# Patient Record
Sex: Female | Born: 1990 | Race: Black or African American | Hispanic: No | Marital: Single | Smoking: Never smoker
Health system: Southern US, Community
[De-identification: ages and names within clinical notes are randomized; demographics above are authoritative.]

## PROBLEM LIST (undated history)

## (undated) DIAGNOSIS — I10 Essential (primary) hypertension: Secondary | ICD-10-CM

## (undated) DIAGNOSIS — Z8619 Personal history of other infectious and parasitic diseases: Secondary | ICD-10-CM

## (undated) HISTORY — PX: NO PAST SURGERIES: SHX2092

---

## 2009-02-07 ENCOUNTER — Emergency Department (HOSPITAL_COMMUNITY): Admission: EM | Admit: 2009-02-07 | Discharge: 2009-02-07 | Payer: Self-pay | Admitting: Emergency Medicine

## 2009-05-06 ENCOUNTER — Emergency Department (HOSPITAL_COMMUNITY): Admission: EM | Admit: 2009-05-06 | Discharge: 2009-05-06 | Payer: Self-pay | Admitting: Family Medicine

## 2009-05-08 ENCOUNTER — Emergency Department (HOSPITAL_BASED_OUTPATIENT_CLINIC_OR_DEPARTMENT_OTHER): Admission: EM | Admit: 2009-05-08 | Discharge: 2009-05-08 | Payer: Self-pay | Admitting: Emergency Medicine

## 2009-11-21 ENCOUNTER — Emergency Department (HOSPITAL_BASED_OUTPATIENT_CLINIC_OR_DEPARTMENT_OTHER): Admission: EM | Admit: 2009-11-21 | Discharge: 2009-11-21 | Payer: Self-pay | Admitting: Emergency Medicine

## 2009-12-17 ENCOUNTER — Emergency Department (HOSPITAL_COMMUNITY): Admission: EM | Admit: 2009-12-17 | Discharge: 2009-12-17 | Payer: Self-pay | Admitting: Emergency Medicine

## 2010-02-25 ENCOUNTER — Emergency Department (HOSPITAL_BASED_OUTPATIENT_CLINIC_OR_DEPARTMENT_OTHER)
Admission: EM | Admit: 2010-02-25 | Discharge: 2010-02-25 | Payer: Self-pay | Source: Home / Self Care | Admitting: Emergency Medicine

## 2010-03-23 ENCOUNTER — Emergency Department (HOSPITAL_BASED_OUTPATIENT_CLINIC_OR_DEPARTMENT_OTHER)
Admission: EM | Admit: 2010-03-23 | Discharge: 2010-03-23 | Payer: Self-pay | Source: Home / Self Care | Admitting: Emergency Medicine

## 2010-03-26 LAB — COMPREHENSIVE METABOLIC PANEL
ALT: 34 U/L (ref 0–35)
AST: 32 U/L (ref 0–37)
Albumin: 4.8 g/dL (ref 3.5–5.2)
Alkaline Phosphatase: 66 U/L (ref 39–117)
BUN: 7 mg/dL (ref 6–23)
CO2: 24 mEq/L (ref 19–32)
Calcium: 9.6 mg/dL (ref 8.4–10.5)
Chloride: 108 mEq/L (ref 96–112)
Creatinine, Ser: 0.7 mg/dL (ref 0.4–1.2)
GFR calc Af Amer: >60 mL/min (ref >60)
GFR calc non Af Amer: >60 mL/min (ref >60)
Glucose, Bld: 90 mg/dL (ref 70–99)
Potassium: 4.5 mEq/L (ref 3.5–5.1)
Sodium: 145 mEq/L (ref 135–145)
Total Bilirubin: 1.2 mg/dL (ref 0.3–1.2)
Total Protein: 9 g/dL — ABNORMAL HIGH (ref 6.0–8.3)

## 2010-03-26 LAB — CBC
HCT: 37.9 % (ref 36.0–46.0)
Hemoglobin: 13.6 g/dL (ref 12.0–15.0)
MCH: 29.5 pg (ref 26.0–34.0)
MCHC: 35.9 g/dL (ref 30.0–36.0)
MCV: 82.2 fL (ref 78.0–100.0)
Platelets: 226 10*3/uL (ref 150–400)
RBC: 4.61 MIL/uL (ref 3.87–5.11)
RDW: 12.1 % (ref 11.5–15.5)
WBC: 3.7 10*3/uL — ABNORMAL LOW (ref 4.0–10.5)

## 2010-03-26 LAB — DIFFERENTIAL
Basophils Absolute: 0 10*3/uL (ref 0.0–0.1)
Basophils Relative: 1 % (ref 0–1)
Eosinophils Absolute: 0 10*3/uL (ref 0.0–0.7)
Eosinophils Relative: 1 % (ref 0–5)
Lymphocytes Relative: 31 % (ref 12–46)
Lymphs Abs: 1.2 10*3/uL (ref 0.7–4.0)
Monocytes Absolute: 0.4 10*3/uL (ref 0.1–1.0)
Monocytes Relative: 10 % (ref 3–12)
Neutro Abs: 2.2 10*3/uL (ref 1.7–7.7)
Neutrophils Relative %: 58 % (ref 43–77)

## 2010-03-26 LAB — URINALYSIS, ROUTINE W REFLEX MICROSCOPIC
Hgb urine dipstick: NEGATIVE
Ketones, ur: 15 mg/dL — AB
Nitrite: NEGATIVE
Protein, ur: NEGATIVE mg/dL
Specific Gravity, Urine: 1.027 (ref 1.005–1.030)
Urine Glucose, Fasting: NEGATIVE mg/dL
Urobilinogen, UA: 1 mg/dL (ref 0.0–1.0)
pH: 6 (ref 5.0–8.0)

## 2010-03-26 LAB — LIPASE, BLOOD: Lipase: 126 U/L (ref 23–300)

## 2010-03-26 LAB — URINE MICROSCOPIC-ADD ON

## 2010-03-26 LAB — PREGNANCY, URINE: Preg Test, Ur: NEGATIVE

## 2010-05-24 LAB — URINALYSIS, ROUTINE W REFLEX MICROSCOPIC
Bilirubin Urine: NEGATIVE
Glucose, UA: NEGATIVE mg/dL
Hgb urine dipstick: NEGATIVE
Ketones, ur: NEGATIVE mg/dL
Nitrite: NEGATIVE
Protein, ur: NEGATIVE mg/dL
Specific Gravity, Urine: 1.011 (ref 1.005–1.030)
Urobilinogen, UA: 1 mg/dL (ref 0.0–1.0)
pH: 7.5 (ref 5.0–8.0)

## 2010-05-24 LAB — POCT URINALYSIS DIPSTICK
Bilirubin Urine: NEGATIVE
Glucose, UA: NEGATIVE mg/dL
Hgb urine dipstick: NEGATIVE
Nitrite: NEGATIVE
Protein, ur: NEGATIVE mg/dL
Specific Gravity, Urine: 1.015 (ref 1.005–1.030)
Urobilinogen, UA: 1 mg/dL (ref 0.0–1.0)
pH: 7 (ref 5.0–8.0)

## 2010-05-24 LAB — DIFFERENTIAL
Basophils Absolute: 0 10*3/uL (ref 0.0–0.1)
Basophils Relative: 1 % (ref 0–1)
Eosinophils Absolute: 0 10*3/uL (ref 0.0–0.7)
Eosinophils Relative: 1 % (ref 0–5)
Lymphocytes Relative: 29 % (ref 12–46)
Lymphs Abs: 1.3 10*3/uL (ref 0.7–4.0)
Monocytes Absolute: 0.5 10*3/uL (ref 0.1–1.0)
Monocytes Relative: 10 % (ref 3–12)
Neutro Abs: 2.7 10*3/uL (ref 1.7–7.7)
Neutrophils Relative %: 59 % (ref 43–77)

## 2010-05-24 LAB — WET PREP, GENITAL
Trich, Wet Prep: NONE SEEN
Yeast Wet Prep HPF POC: NONE SEEN

## 2010-05-24 LAB — BASIC METABOLIC PANEL
BUN: 8 mg/dL (ref 6–23)
CO2: 26 mEq/L (ref 19–32)
Calcium: 9.2 mg/dL (ref 8.4–10.5)
Chloride: 105 mEq/L (ref 96–112)
Creatinine, Ser: 0.7 mg/dL (ref 0.4–1.2)
GFR calc Af Amer: >60 mL/min (ref >60)
GFR calc non Af Amer: >60 mL/min (ref >60)
Glucose, Bld: 91 mg/dL (ref 70–99)
Potassium: 3.9 mEq/L (ref 3.5–5.1)
Sodium: 141 mEq/L (ref 135–145)

## 2010-05-24 LAB — CBC
HCT: 35.3 % — ABNORMAL LOW (ref 36.0–46.0)
Hemoglobin: 12.3 g/dL (ref 12.0–15.0)
MCH: 30.4 pg (ref 26.0–34.0)
MCHC: 34.9 g/dL (ref 30.0–36.0)
MCV: 87.2 fL (ref 78.0–100.0)
Platelets: 195 10*3/uL (ref 150–400)
RBC: 4.05 MIL/uL (ref 3.87–5.11)
RDW: 13.1 % (ref 11.5–15.5)
WBC: 4.5 10*3/uL (ref 4.0–10.5)

## 2010-05-24 LAB — GC/CHLAMYDIA PROBE AMP, GENITAL
Chlamydia, DNA Probe: NEGATIVE
GC Probe Amp, Genital: NEGATIVE

## 2010-05-24 LAB — PREGNANCY, URINE: Preg Test, Ur: NEGATIVE

## 2010-05-24 LAB — POCT PREGNANCY, URINE: Preg Test, Ur: NEGATIVE

## 2011-02-27 ENCOUNTER — Ambulatory Visit: Payer: Self-pay

## 2011-09-05 ENCOUNTER — Emergency Department (HOSPITAL_BASED_OUTPATIENT_CLINIC_OR_DEPARTMENT_OTHER)
Admission: EM | Admit: 2011-09-05 | Discharge: 2011-09-05 | Disposition: A | Discharge status: Home / Self Care | Payer: Medicaid Other | Attending: Emergency Medicine | Admitting: Emergency Medicine

## 2011-09-05 ENCOUNTER — Encounter (HOSPITAL_BASED_OUTPATIENT_CLINIC_OR_DEPARTMENT_OTHER): Payer: Self-pay | Admitting: *Deleted

## 2011-09-05 ENCOUNTER — Emergency Department (HOSPITAL_BASED_OUTPATIENT_CLINIC_OR_DEPARTMENT_OTHER): Payer: Medicaid Other

## 2011-09-05 DIAGNOSIS — O2340 Unspecified infection of urinary tract in pregnancy, unspecified trimester: Secondary | ICD-10-CM

## 2011-09-05 DIAGNOSIS — O99891 Other specified diseases and conditions complicating pregnancy: Secondary | ICD-10-CM | POA: Insufficient documentation

## 2011-09-05 DIAGNOSIS — R109 Unspecified abdominal pain: Secondary | ICD-10-CM | POA: Insufficient documentation

## 2011-09-05 DIAGNOSIS — N390 Urinary tract infection, site not specified: Secondary | ICD-10-CM | POA: Insufficient documentation

## 2011-09-05 DIAGNOSIS — O210 Mild hyperemesis gravidarum: Secondary | ICD-10-CM | POA: Insufficient documentation

## 2011-09-05 DIAGNOSIS — O219 Vomiting of pregnancy, unspecified: Secondary | ICD-10-CM

## 2011-09-05 DIAGNOSIS — O2390 Unspecified genitourinary tract infection in pregnancy, unspecified trimester: Secondary | ICD-10-CM | POA: Insufficient documentation

## 2011-09-05 LAB — COMPREHENSIVE METABOLIC PANEL
ALT: 10 U/L (ref 0–35)
AST: 15 U/L (ref 0–37)
Albumin: 4.5 g/dL (ref 3.5–5.2)
Alkaline Phosphatase: 37 U/L — ABNORMAL LOW (ref 39–117)
BUN: 5 mg/dL — ABNORMAL LOW (ref 6–23)
CO2: 25 mEq/L (ref 19–32)
Calcium: 9.6 mg/dL (ref 8.4–10.5)
Chloride: 100 mEq/L (ref 96–112)
Creatinine, Ser: 0.5 mg/dL (ref 0.50–1.10)
GFR calc Af Amer: >90 mL/min (ref >90)
GFR calc non Af Amer: >90 mL/min (ref >90)
Glucose, Bld: 85 mg/dL (ref 70–99)
Potassium: 3.4 mEq/L — ABNORMAL LOW (ref 3.5–5.1)
Sodium: 134 mEq/L — ABNORMAL LOW (ref 135–145)
Total Bilirubin: 0.9 mg/dL (ref 0.3–1.2)
Total Protein: 8.2 g/dL (ref 6.0–8.3)

## 2011-09-05 LAB — CBC WITH DIFFERENTIAL/PLATELET
Basophils Absolute: 0 10*3/uL (ref 0.0–0.1)
Basophils Relative: 0 % (ref 0–1)
Eosinophils Absolute: 0 10*3/uL (ref 0.0–0.7)
Eosinophils Relative: 1 % (ref 0–5)
HCT: 33.5 % — ABNORMAL LOW (ref 36.0–46.0)
Hemoglobin: 12.2 g/dL (ref 12.0–15.0)
Lymphocytes Relative: 31 % (ref 12–46)
Lymphs Abs: 1.2 10*3/uL (ref 0.7–4.0)
MCH: 31.4 pg (ref 26.0–34.0)
MCHC: 36.4 g/dL — ABNORMAL HIGH (ref 30.0–36.0)
MCV: 86.3 fL (ref 78.0–100.0)
Monocytes Absolute: 0.4 10*3/uL (ref 0.1–1.0)
Monocytes Relative: 11 % (ref 3–12)
Neutro Abs: 2.1 10*3/uL (ref 1.7–7.7)
Neutrophils Relative %: 57 % (ref 43–77)
Platelets: 189 10*3/uL (ref 150–400)
RBC: 3.88 MIL/uL (ref 3.87–5.11)
RDW: 11.5 % (ref 11.5–15.5)
WBC: 3.7 10*3/uL — ABNORMAL LOW (ref 4.0–10.5)

## 2011-09-05 LAB — PREGNANCY, URINE: Preg Test, Ur: POSITIVE — AB

## 2011-09-05 LAB — URINALYSIS, ROUTINE W REFLEX MICROSCOPIC
Bilirubin Urine: NEGATIVE
Glucose, UA: NEGATIVE mg/dL
Hgb urine dipstick: NEGATIVE
Ketones, ur: 15 mg/dL — AB
Nitrite: NEGATIVE
Protein, ur: NEGATIVE mg/dL
Specific Gravity, Urine: 1.024 (ref 1.005–1.030)
Urobilinogen, UA: 1 mg/dL (ref 0.0–1.0)
pH: 6 (ref 5.0–8.0)

## 2011-09-05 LAB — URINE MICROSCOPIC-ADD ON: RBC / HPF: NONE SEEN RBC/hpf (ref <3)

## 2011-09-05 LAB — LIPASE, BLOOD: Lipase: 38 U/L (ref 11–59)

## 2011-09-05 MED ORDER — NITROFURANTOIN MONOHYD MACRO 100 MG PO CAPS: 100.0000 mg | ORAL_CAPSULE | Freq: Two times a day (BID) | ORAL | Status: AC

## 2011-09-05 MED ORDER — ONDANSETRON HCL 4 MG/2ML IJ SOLN
4.0000 mg | Freq: Once | INTRAMUSCULAR | Status: AC
  Administered 2011-09-05: 4 mg via INTRAVENOUS
  Filled 2011-09-05: qty 2

## 2011-09-05 MED ORDER — SODIUM CHLORIDE 0.9 % IV BOLUS (SEPSIS)
1000.0000 mL | Freq: Once | INTRAVENOUS | Status: AC
Start: 2011-09-05 — End: 2011-09-05
  Administered 2011-09-05: 1000 mL via INTRAVENOUS

## 2011-09-05 MED ORDER — ONDANSETRON 4 MG PO TBDP: 4.0000 mg | ORAL_TABLET | Freq: Three times a day (TID) | ORAL | Status: AC | PRN

## 2011-09-05 NOTE — Discharge Instructions (Signed)
Urinary Tract Infection in Pregnancy A urinary tract infection (UTI) is a bacterial infection of the urinary tract. Infection of the urinary tract can include the ureters, kidneys (pyelonephritis), bladder (cystitis), and urethra (urethritis). All pregnant women should be screened for bacteria in the urinary tract. Identifying and treating a UTI will decrease the risk of preterm labor and developing more serious infections in both the mother and baby. CAUSES Bacteria germs cause almost all UTIs. There are many factors that can increase your chances of getting a UTI during pregnancy. These include:  Having a short urethra.   Poor toilet and hygiene habits.   Sexual intercourse.   Blockage of urine along the urinary tract.   Problems with the pelvic muscles or nerves.   Diabetes.   Obesity.   Bladder problems after having several children.   Previous history of UTI.  SYMPTOMS   Pain, burning, or a stinging feeling when urinating.   Suddenly feeling the need to urinate right away (urgency).   Loss of bladder control (urinary incontinence).   Frequent urination, more than is common with pregnancy.   Lower abdominal or back discomfort.   Bad smelling urine.   Cloudy urine.   Blood in the urine (hematuria).   Fever.  When the kidneys are infected, the symptoms may be:  Back pain.   Flank pain on the right side more so than the left.   Fever.   Chills.   Nausea.   Vomiting.  DIAGNOSIS   Urine tests.   Additional tests and procedures may include:   Ultrasound of the kidneys, ureters, bladder, and urethra.   Looking in the bladder with a lighted tube (cystoscopy).   Certain X-ray studies only when absolutely necessary.  Finding out the results of your test Ask when your test results will be ready. Make sure you get your test results. TREATMENT  Antibiotic medicine by mouth.   Antibiotics given through the vein (intravenously), if needed.  HOME CARE  INSTRUCTIONS   Take your antibiotics as directed. Finish them even if you start to feel better. Only take medicine as directed by your caregiver.   Drink enough fluids to keep your urine clear or pale yellow.   Do not have sexual intercourse until the infection is gone and your caregiver says it is okay.   Make sure you are tested for UTIs throughout your pregnancy if you get one. These infections often come back.  Preventing a UTI in the future:  Practice good toilet habits. Always wipe from front to back. Use the tissue only once.   Do not hold your urine. Empty your bladder as soon as possible when the urge comes.   Do not douche or use deodorant sprays.   Wash with soap and warm water around the genital area and the anus.   Empty your bladder before and after sexual intercourse.   Wear underwear with a cotton crotch.   Avoid caffeine and carbonated drinks. They can irritate the bladder.   Drink cranberry juice or take cranberry pills. This may decrease the risk of getting a UTI.   Do not drink alcohol.   Keep all your appointments and tests as scheduled.  SEEK MEDICAL CARE IF:   Your symptoms get worse.   You are still having fevers 2 or more days after treatment begins.   You develop a rash.   You feel that you are having problems with medicines prescribed.   You develop abnormal vaginal discharge.  SEEK IMMEDIATE MEDICAL   CARE IF:   You develop back or flank pain.   You develop chills.   You have blood in your urine.   You develop nausea and vomiting.   You develop contractions of your uterus.   You have a gush of fluid from the vagina.  MAKE SURE YOU:   Understand these instructions.   Will watch your condition.   Will get help right away if you are not doing well or get worse.  Document Released: 06/22/2010 Document Revised: 02/14/2011 Document Reviewed: 06/22/2010 Exit[employer] Patient Information 2012 [employer]. 

## 2011-09-05 NOTE — ED Provider Notes (Signed)
History     CSN: [id number]  Arrival date & time 09/05/11  1325   First MD Initiated Contact with Patient 09/05/11 1412      Chief Complaint  Patient presents with  . Morning Sickness    (Consider location/radiation/quality/duration/timing/severity/associated sxs/prior treatment) HPI Comments: Pt states that based on lmp she she is 7 weeks:pt states that she had test and ultrasound at [facility] and they also gave phenergan:pt states that she called her doctor and   Patient is a 21 y.o. female presenting with vomiting. The history is provided by the patient. No language interpreter was used.  Emesis  This is a new problem. The current episode started more than 1 week ago. The problem occurs 5 to 10 times per day. The problem has been gradually worsening. The emesis has an appearance of bilious material. There has been no fever. Associated symptoms include abdominal pain. Pertinent negatives include no cough, no diarrhea and no fever.    History reviewed. No pertinent past medical history.  History reviewed. No pertinent past surgical history.  History reviewed. No pertinent family history.  History  Substance Use Topics  . Smoking status: Never Smoker   . Smokeless tobacco: Not on file  . Alcohol Use: No    OB History    Grav Para Term Preterm Abortions TAB SAB Ect Mult Living   1               Review of Systems  Constitutional: Negative for fever.  Respiratory: Negative for cough.   Cardiovascular: Negative.   Gastrointestinal: Positive for vomiting and abdominal pain. Negative for diarrhea.  Genitourinary: Negative for dysuria.  Musculoskeletal: Negative.     Allergies  Review of patient's allergies indicates no known allergies.  Home Medications  No current outpatient prescriptions on file.  BP 119/69  Pulse 79  Temp 98.8 F (37.1 C) (Oral)  Resp 20  Ht 5\' 5"  (1.651 m)  Wt 167 lb (75.751 kg)  BMI 27.79 kg/m2  SpO2 99%  LMP 07/11/2011  Physical  Exam  Constitutional: She is oriented to person, place, and time. She appears well-developed and well-nourished.  HENT:  Head: Normocephalic and atraumatic.  Eyes: EOM are normal.  Neck: Neck supple.  Cardiovascular: Normal rate and regular rhythm.   Pulmonary/Chest: Effort normal and breath sounds normal.  Abdominal: Soft. Bowel sounds are normal. There is no tenderness.  Musculoskeletal: Normal range of motion.  Neurological: She is alert and oriented to person, place, and time.  Skin: Skin is warm and dry.    ED Course  Procedures (including critical care time)  Labs Reviewed  URINALYSIS, ROUTINE W REFLEX MICROSCOPIC - Abnormal; Notable for the following:    APPearance CLOUDY (*)     Ketones, ur 15 (*)     Leukocytes, UA MODERATE (*)     All other components within normal limits  PREGNANCY, URINE - Abnormal; Notable for the following:    Preg Test, Ur POSITIVE (*)     All other components within normal limits  URINE MICROSCOPIC-ADD ON - Abnormal; Notable for the following:    Squamous Epithelial / LPF FEW (*)     Bacteria, UA MANY (*)     All other components within normal limits  COMPREHENSIVE METABOLIC PANEL  CBC WITH DIFFERENTIAL  LIPASE, BLOOD  URINE CULTURE   US Abdomen Complete  09/05/2011  *RADIOLOGY REPORT*  Clinical Data:  8-week pregnant.  Nausea, vomiting. Right upper quadrant pain.  COMPLETE ABDOMINAL ULTRASOUND  Comparison:  Ultrasound 03/23/2010  Findings:  Gallbladder:  No gallstones, gallbladder wall thickening, or pericholecystic fluid.  Common bile duct:   Within normal limits in caliber.  Liver:  No focal lesion identified.  Within normal limits in parenchymal echogenicity.  IVC:  Appears normal.  Pancreas:  No focal abnormality seen.  Spleen:  Within normal limits in size and echotexture.  Right Kidney:   Normal in size and parenchymal echogenicity.  No evidence of mass or hydronephrosis.  Left Kidney:  Normal in size and parenchymal echogenicity.  No  evidence of mass or hydronephrosis.  Abdominal aorta:  No aneurysm identified.  IMPRESSION: Negative abdominal ultrasound.  Original Report Authenticated By: Cyndie Chime, M.D.     1. Nausea/vomiting in pregnancy   2. UTI (urinary tract infection) during pregnancy       MDM  Pt tolerating po here:result obtained from [facility] and pt has pelvic ultrasound:pt has no signs of gallstones:will treat for possible ZOX:[id number] sent      Teressa Lower, NP 09/05/11 1738  Teressa Lower, NP 09/05/11 1739

## 2011-09-05 NOTE — ED Notes (Signed)
Pt states she thinks she is pregnant has been having nausea in the morning for the last week

## 2011-09-05 NOTE — ED Notes (Signed)
Called [facility] for labs, xrays, and US done on last visit.

## 2011-09-06 LAB — URINE CULTURE

## 2011-09-07 NOTE — ED Provider Notes (Signed)
Medical screening examination/treatment/procedure(s) were performed by non-physician practitioner and as supervising physician I was immediately available for consultation/collaboration.   Vann Okerlund, MD 09/07/11 0838 

## 2011-11-03 ENCOUNTER — Encounter (HOSPITAL_COMMUNITY): Payer: Self-pay | Admitting: *Deleted

## 2011-11-03 ENCOUNTER — Inpatient Hospital Stay (HOSPITAL_COMMUNITY)
Admission: AD | Admit: 2011-11-03 | Discharge: 2011-11-03 | Disposition: A | Discharge status: Home / Self Care | Payer: Medicaid Other | Source: Ambulatory Visit | Attending: Family Medicine | Admitting: Family Medicine

## 2011-11-03 DIAGNOSIS — R109 Unspecified abdominal pain: Secondary | ICD-10-CM

## 2011-11-03 DIAGNOSIS — O99891 Other specified diseases and conditions complicating pregnancy: Secondary | ICD-10-CM | POA: Insufficient documentation

## 2011-11-03 HISTORY — DX: Personal history of other infectious and parasitic diseases: Z86.19

## 2011-11-03 MED ORDER — IBUPROFEN 600 MG PO TABS
600.0000 mg | ORAL_TABLET | Freq: Once | ORAL | Status: AC
  Administered 2011-11-03: 600 mg via ORAL
  Filled 2011-11-03: qty 1

## 2011-11-03 NOTE — MAU Note (Signed)
Pt states, " I started having burning pain in there left mid abdomen at 8:30pm. I threw up one time since I've been hurting."

## 2011-11-03 NOTE — MAU Provider Note (Signed)
Chart reviewed and agree with management and plan.  

## 2011-11-03 NOTE — MAU Provider Note (Signed)
  History     CSN: [id number]  Arrival date and time: 11/03/11 4098   First Provider Initiated Contact with Patient 11/03/11 0131      Chief Complaint  Patient presents with  . Abdominal Pain   HPI Laura Lara is a 21yo [id number] at 15.6wks who presents with onset left mid abd pain at 2030. Describes pain as a 'burning' sensation. Feels better when she tightens her stomach muscles. Still having pain now but it is more diminished than it was earlier. Denies bldg or leak. Receives [facility] in [place] and says she has been having difficulty with hyperemesis this preg. Denies diarrhea or fever.  OB History    Grav Para Term Preterm Abortions TAB SAB Ect Mult Living   4 2 2  1  1   2       Past Medical History  Diagnosis Date  . H/O sexually transmitted disease     2010    Past Surgical History  Procedure Date  . No past surgeries     Family History  Problem Relation Age of Onset  . Other Neg Hx     History  Substance Use Topics  . Smoking status: Never Smoker   . Smokeless tobacco: Not on file  . Alcohol Use: No    Allergies: No Known Allergies  Prescriptions prior to admission  Medication Sig Dispense Refill  . ondansetron (ZOFRAN-ODT) 8 MG disintegrating tablet Take 8 mg by mouth every 8 (eight) hours as needed. Patient used this medication for nausea.      . Prenatal Vit-Fe Fumarate-FA (PRENATAL MULTIVITAMIN) TABS Take 1 tablet by mouth daily.        ROS Physical Exam   Blood pressure 131/83, pulse 102, temperature 98.5 F (36.9 C), temperature source Oral, height 5\' 4"  (1.626 m), weight 70.421 kg (155 lb 4 oz), last menstrual period 07/11/2011.  Physical Exam  Constitutional: She is oriented to person, place, and time. She appears well-developed.  HENT:  Head: Normocephalic.  Cardiovascular: Normal rate.   Respiratory: Effort normal.  GI: Soft.  Genitourinary: Vagina normal.       Cx C/L  Musculoskeletal: Normal range of motion.  Neurological: She is alert and  oriented to person, place, and time.  Skin: Skin is warm and dry.  Psychiatric: She has a normal mood and affect. Her behavior is normal. Thought content normal.    MAU Course  Procedures  MDM Given Motrin while in MAU and pain has subsided. She was unable to urinate while here and she elected to leave without a UA.   Assessment and Plan  IUP at 15.6wks Abd pain  Enc to try to drink more fluids as she is probably somewhat dehydrated. She has an OB appt on 8/28, but encouraged to call sooner if needed.  Cam Hai 11/03/2011, 1:46 AM

## 2012-12-30 ENCOUNTER — Encounter (HOSPITAL_BASED_OUTPATIENT_CLINIC_OR_DEPARTMENT_OTHER): Payer: Self-pay | Admitting: Emergency Medicine

## 2012-12-30 ENCOUNTER — Emergency Department (HOSPITAL_BASED_OUTPATIENT_CLINIC_OR_DEPARTMENT_OTHER)
Admission: EM | Admit: 2012-12-30 | Discharge: 2012-12-30 | Disposition: A | Discharge status: Home / Self Care | Payer: Medicaid Other | Attending: Emergency Medicine | Admitting: Emergency Medicine

## 2012-12-30 DIAGNOSIS — B3731 Acute candidiasis of vulva and vagina: Secondary | ICD-10-CM | POA: Insufficient documentation

## 2012-12-30 DIAGNOSIS — Z3202 Encounter for pregnancy test, result negative: Secondary | ICD-10-CM | POA: Insufficient documentation

## 2012-12-30 DIAGNOSIS — Z79899 Other long term (current) drug therapy: Secondary | ICD-10-CM | POA: Insufficient documentation

## 2012-12-30 DIAGNOSIS — I10 Essential (primary) hypertension: Secondary | ICD-10-CM | POA: Insufficient documentation

## 2012-12-30 DIAGNOSIS — N760 Acute vaginitis: Secondary | ICD-10-CM | POA: Insufficient documentation

## 2012-12-30 DIAGNOSIS — B373 Candidiasis of vulva and vagina: Secondary | ICD-10-CM | POA: Insufficient documentation

## 2012-12-30 HISTORY — DX: Essential (primary) hypertension: I10

## 2012-12-30 LAB — URINE MICROSCOPIC-ADD ON

## 2012-12-30 LAB — URINALYSIS, ROUTINE W REFLEX MICROSCOPIC
Glucose, UA: NEGATIVE mg/dL
Hgb urine dipstick: NEGATIVE
pH: 7.5 (ref 5.0–8.0)

## 2012-12-30 LAB — WET PREP, GENITAL: Trich, Wet Prep: NONE SEEN

## 2012-12-30 LAB — PREGNANCY, URINE: Preg Test, Ur: NEGATIVE

## 2012-12-30 MED ORDER — FLUCONAZOLE 150 MG PO TABS: 150.0000 mg | ORAL_TABLET | Freq: Once | ORAL | Status: AC

## 2012-12-30 NOTE — ED Notes (Signed)
Patient states she has vaginal itching and swelling for the three days ago.  States she was having intercourse and was using a latex condom and began to have itching.  States itching has intensified and is now swollen.  Denies vaginal discharge.

## 2012-12-30 NOTE — ED Provider Notes (Signed)
CSN: [id number]     Arrival date & time 12/30/12  1146 History   First MD Initiated Contact with Patient 12/30/12 1247     Chief Complaint  Patient presents with  . Vaginal Itching   (Consider location/radiation/quality/duration/timing/severity/associated sxs/prior Treatment) Patient is a 22 y.o. female presenting with vaginal itching. The history is provided by the patient. No language interpreter was used.  Vaginal Itching This is a new problem. Episode onset: 3 days. The problem occurs constantly. The problem has been unchanged. Associated symptoms comments: Vaginal discharge. Nothing aggravates the symptoms. She has tried nothing for the symptoms. The treatment provided no relief.    Past Medical History  Diagnosis Date  . H/O sexually transmitted disease     2010  . Hypertension    Past Surgical History  Procedure Laterality Date  . No past surgeries    . Cesarean section     Family History  Problem Relation Age of Onset  . Other Neg Hx    History  Substance Use Topics  . Smoking status: Never Smoker   . Smokeless tobacco: Not on file  . Alcohol Use: No   OB History   Grav Para Term Preterm Abortions TAB SAB Ect Mult Living   4 2 2  1  1   2      Review of Systems  Genitourinary: Positive for vaginal discharge.  All other systems reviewed and are negative.    Allergies  Review of patient's allergies indicates no known allergies.  Home Medications   Current Outpatient Rx  Name  Route  Sig  Dispense  Refill  . ALPRAZolam (XANAX) 0.5 MG tablet   Oral   Take 0.5 mg by mouth at bedtime as needed for sleep.         Marland Kitchen ondansetron (ZOFRAN-ODT) 8 MG disintegrating tablet   Oral   Take 8 mg by mouth every 8 (eight) hours as needed. Patient used this medication for nausea.         . Prenatal Vit-Fe Fumarate-FA (PRENATAL MULTIVITAMIN) TABS   Oral   Take 1 tablet by mouth daily.          BP 123/81  Temp(Src) 98.1 F (36.7 C) (Oral)  Resp 20  Ht 5\' 6"   (1.676 m)  Wt 193 lb (87.544 kg)  BMI 31.17 kg/m2  SpO2 100%  LMP 12/16/2012  Breastfeeding? Unknown Physical Exam  Vitals reviewed. Constitutional: She appears well-developed and well-nourished.  HENT:  Head: Normocephalic.  Eyes: Pupils are equal, round, and reactive to light.  Neck: Normal range of motion.  Cardiovascular: Normal rate.   Pulmonary/Chest: Effort normal.  Abdominal: Soft.  Genitourinary: Vaginal discharge found.  Musculoskeletal: Normal range of motion.  Neurological: She is alert.  Skin: Skin is warm.    ED Course  Procedures (including critical care time) Labs Review Labs Reviewed  WET PREP, GENITAL - Abnormal; Notable for the following:    Yeast Wet Prep HPF POC MODERATE (*)    WBC, Wet Prep HPF POC FEW (*)    All other components within normal limits  URINALYSIS, ROUTINE W REFLEX MICROSCOPIC - Abnormal; Notable for the following:    APPearance CLOUDY (*)    Leukocytes, UA SMALL (*)    All other components within normal limits  URINE MICROSCOPIC-ADD ON - Abnormal; Notable for the following:    Squamous Epithelial / LPF FEW (*)    Bacteria, UA MANY (*)    All other components within normal limits  URINE CULTURE  GC/CHLAMYDIA PROBE AMP  PREGNANCY, URINE   Imaging Review No results found.  EKG Interpretation   None       MDM   1. Yeast vaginitis    Diflucan.   Cultures pending    Elson Areas, PA-C 12/30/12 1344

## 2012-12-31 LAB — GC/CHLAMYDIA PROBE AMP
CT Probe RNA: NEGATIVE
GC Probe RNA: NEGATIVE

## 2012-12-31 LAB — URINE CULTURE

## 2012-12-31 NOTE — ED Provider Notes (Signed)
Medical screening examination/treatment/procedure(s) were performed by non-physician practitioner and as supervising physician I was immediately available for consultation/collaboration.  EKG Interpretation   None         Roney Marion, MD 12/31/12 [phone]

## 2013-05-07 ENCOUNTER — Emergency Department (HOSPITAL_BASED_OUTPATIENT_CLINIC_OR_DEPARTMENT_OTHER)
Admission: EM | Admit: 2013-05-07 | Discharge: 2013-05-07 | Disposition: A | Discharge status: Home / Self Care | Payer: Medicaid Other | Attending: Emergency Medicine | Admitting: Emergency Medicine

## 2013-05-07 ENCOUNTER — Encounter (HOSPITAL_BASED_OUTPATIENT_CLINIC_OR_DEPARTMENT_OTHER): Payer: Self-pay | Admitting: Emergency Medicine

## 2013-05-07 DIAGNOSIS — A6000 Herpesviral infection of urogenital system, unspecified: Secondary | ICD-10-CM

## 2013-05-07 DIAGNOSIS — I10 Essential (primary) hypertension: Secondary | ICD-10-CM | POA: Insufficient documentation

## 2013-05-07 DIAGNOSIS — Z79899 Other long term (current) drug therapy: Secondary | ICD-10-CM | POA: Insufficient documentation

## 2013-05-07 MED ORDER — ACYCLOVIR 200 MG PO CAPS
400.0000 mg | ORAL_CAPSULE | Freq: Once | ORAL | Status: AC
  Administered 2013-05-07: 400 mg via ORAL
  Filled 2013-05-07: qty 2

## 2013-05-07 MED ORDER — ACYCLOVIR 400 MG PO TABS: 400.0000 mg | ORAL_TABLET | Freq: Three times a day (TID) | ORAL | Status: AC

## 2013-05-07 NOTE — ED Notes (Signed)
Red, painful area to her right buttocks since yesterday. She thinks it may be a bug bite. No hx of abscess.

## 2013-05-07 NOTE — Discharge Instructions (Signed)
Genital Herpes  Genital herpes is a sexually transmitted disease. This means that it is a disease passed by having sex with an infected person. There is no cure for genital herpes. The time between attacks can be months to years. The virus may live in a person but produce no problems (symptoms). This infection can be passed to a baby as it travels down the birth canal (vagina). In a newborn, this can cause central nervous system damage, eye damage, or even death. The virus that causes genital herpes is usually HSV-2 virus. The virus that causes oral herpes is usually HSV-1. The diagnosis (learning what is wrong) is made through culture results.  SYMPTOMS   Usually symptoms of pain and itching begin a few days to a week after contact. It first appears as small blisters that progress to small painful ulcers which then scab over and heal after several days. It affects the outer genitalia, birth canal, cervix, penis, anal area, buttocks, and thighs.  HOME CARE INSTRUCTIONS   · Keep ulcerated areas dry and clean.  · Take medications as directed. Antiviral medications can speed up healing. They will not prevent recurrences or cure this infection. These medications can also be taken for suppression if there are frequent recurrences.  · While the infection is active, it is contagious. Avoid all sexual contact during active infections.  · Condoms may help prevent spread of the herpes virus.  · Practice safe sex.  · Wash your hands thoroughly after touching the genital area.  · Avoid touching your eyes after touching your genital area.  · Inform your caregiver if you have had genital herpes and become pregnant. It is your responsibility to insure a safe outcome for your baby in this pregnancy.  · Only take over-the-counter or prescription medicines for pain, discomfort, or fever as directed by your caregiver.  SEEK MEDICAL CARE IF:   · You have a recurrence of this infection.  · You do not respond to medications and are not  improving.  · You have new sources of pain or discharge which have changed from the original infection.  · You have an oral temperature above 102° F (38.9° C).  · You develop abdominal pain.  · You develop eye pain or signs of eye infection.  Document Released: 02/23/2000 Document Revised: 05/20/2011 Document Reviewed: 03/15/2009  Exit[employer]® Patient Information ©2014 [employer].

## 2013-05-07 NOTE — ED Provider Notes (Signed)
CSN: [id number]     Arrival date & time 05/07/13  2235 History  This chart was scribed for Sriyan Cutting Smitty Cords, MD by Joaquin Music, ED Scribe. This patient was seen in room MH02/MH02 and the patient's care was started at 11:11 PM.   Chief Complaint  Patient presents with  . Insect Bite   Patient is a 23 y.o. female presenting with rash. The history is provided by the patient. No language interpreter was used.  Rash Location:  Ano-genital Ano-genital rash location:  R buttock Quality: painful and redness   Quality: not itchy   Pain details:    Quality:  Aching   Severity:  Mild   Onset quality:  Sudden   Timing:  Constant   Progression:  Unchanged Severity:  Mild Onset quality:  Sudden Timing:  Constant Progression:  Unchanged Chronicity:  New Relieved by:  Nothing Worsened by:  Nothing tried Ineffective treatments:  None tried  HPI Comments: Laura Lara is a 23 y.o. female who presents to the Emergency Department complaining of possible insect bit she noticed yesterday evening.Pt states area to R buttocks has been red and painful since yesterday afternoon. Pt suspects this may be a bug bite. Rates her current pain 5/10. She denies hx of anscess. Pt denies taking any OTC medications. Pt denies itching.  Past Medical History  Diagnosis Date  . H/O sexually transmitted disease     2010  . Hypertension    Past Surgical History  Procedure Laterality Date  . No past surgeries    . Cesarean section     Family History  Problem Relation Age of Onset  . Other Neg Hx    History  Substance Use Topics  . Smoking status: Never Smoker   . Smokeless tobacco: Not on file  . Alcohol Use: No   OB History   Grav Para Term Preterm Abortions TAB SAB Ect Mult Living   4 2 2  1  1   2      Review of Systems  Skin: Positive for rash.  All other systems reviewed and are negative.   Allergies  Review of patient's allergies indicates no known allergies.  Home  Medications   Current Outpatient Rx  Name  Route  Sig  Dispense  Refill  . ALPRAZolam (XANAX) 0.5 MG tablet   Oral   Take 0.5 mg by mouth at bedtime as needed for sleep.         . fluconazole (DIFLUCAN) 150 MG tablet   Oral   Take 1 tablet (150 mg total) by mouth once.   1 tablet   1   . ondansetron (ZOFRAN-ODT) 8 MG disintegrating tablet   Oral   Take 8 mg by mouth every 8 (eight) hours as needed. Patient used this medication for nausea.         . Prenatal Vit-Fe Fumarate-FA (PRENATAL MULTIVITAMIN) TABS   Oral   Take 1 tablet by mouth daily.          BP 129/83  Pulse 107  Temp(Src) 99.7 F (37.6 C) (Oral)  Resp 20  Ht 5\' 5"  (1.651 m)  Wt 193 lb (87.544 kg)  BMI 32.12 kg/m2  SpO2 100%  LMP 05/02/2013  Physical Exam  Constitutional: She is oriented to person, place, and time. She appears well-developed and well-nourished.  HENT:  Head: Normocephalic and atraumatic.  Mouth/Throat: Oropharynx is clear and moist. No oropharyngeal exudate.  Eyes: EOM are normal. Pupils are equal, round, and reactive to light.  Neck: Normal range of motion. Neck supple.  Cardiovascular: Normal rate, regular rhythm and normal heart sounds.  Exam reveals no gallop and no friction rub.   No murmur heard. Pulmonary/Chest: Breath sounds normal. She has no wheezes.  Abdominal: Soft. Bowel sounds are normal. There is no tenderness.  Musculoskeletal: Normal range of motion.  Neurological: She is alert and oriented to person, place, and time.  Skin: Skin is warm and dry.  Cluster of vesicles to R buttocks.  Psychiatric: She has a normal mood and affect.   ED Course  Procedures DIAGNOSTIC STUDIES: Oxygen Saturation is 100% on RA, normal by my interpretation.    COORDINATION OF CARE: 11:13 PM-Discussed treatment plan which includes discharge pt with medications and F/U with PCP or GU. Pt agreed to plan.   Labs Review Labs Reviewed - No data to display Imaging Review No results  found.   EKG Interpretation None     MDM   Final diagnoses:  None    Herpes outbreak will start acyclovir  I personally performed the services described in this documentation, which was scribed in my presence. The recorded information has been reviewed and is accurate.     Jasmine AweApril K Dunya Meiners-Rasch, MD 05/08/13 [phone]

## 2014-01-10 ENCOUNTER — Encounter (HOSPITAL_BASED_OUTPATIENT_CLINIC_OR_DEPARTMENT_OTHER): Payer: Self-pay | Admitting: Emergency Medicine

## 2015-12-29 ENCOUNTER — Encounter (HOSPITAL_BASED_OUTPATIENT_CLINIC_OR_DEPARTMENT_OTHER): Payer: Self-pay | Admitting: Emergency Medicine

## 2015-12-29 ENCOUNTER — Emergency Department (HOSPITAL_BASED_OUTPATIENT_CLINIC_OR_DEPARTMENT_OTHER)
Admission: EM | Admit: 2015-12-29 | Discharge: 2015-12-29 | Disposition: A | Discharge status: Home / Self Care | Payer: BLUE CROSS/BLUE SHIELD | Attending: Emergency Medicine | Admitting: Emergency Medicine

## 2015-12-29 DIAGNOSIS — Y999 Unspecified external cause status: Secondary | ICD-10-CM | POA: Diagnosis not present

## 2015-12-29 DIAGNOSIS — S161XXA Strain of muscle, fascia and tendon at neck level, initial encounter: Secondary | ICD-10-CM | POA: Diagnosis not present

## 2015-12-29 DIAGNOSIS — I10 Essential (primary) hypertension: Secondary | ICD-10-CM | POA: Insufficient documentation

## 2015-12-29 DIAGNOSIS — Y9389 Activity, other specified: Secondary | ICD-10-CM | POA: Diagnosis not present

## 2015-12-29 DIAGNOSIS — Y92410 Unspecified street and highway as the place of occurrence of the external cause: Secondary | ICD-10-CM | POA: Diagnosis not present

## 2015-12-29 DIAGNOSIS — Z79899 Other long term (current) drug therapy: Secondary | ICD-10-CM | POA: Diagnosis not present

## 2015-12-29 DIAGNOSIS — S199XXA Unspecified injury of neck, initial encounter: Secondary | ICD-10-CM | POA: Diagnosis present

## 2015-12-29 MED ORDER — CYCLOBENZAPRINE HCL 10 MG PO TABS: 10.0000 mg | ORAL_TABLET | Freq: Two times a day (BID) | ORAL | 0 refills | Status: AC | PRN
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
## 2019-08-14 ENCOUNTER — Emergency Department (HOSPITAL_BASED_OUTPATIENT_CLINIC_OR_DEPARTMENT_OTHER): Payer: Medicaid Other

## 2019-08-14 ENCOUNTER — Emergency Department (HOSPITAL_BASED_OUTPATIENT_CLINIC_OR_DEPARTMENT_OTHER)
Admission: EM | Admit: 2019-08-14 | Discharge: 2019-08-14 | Disposition: A | Discharge status: Home / Self Care | Payer: Medicaid Other | Attending: Emergency Medicine | Admitting: Emergency Medicine

## 2019-08-14 ENCOUNTER — Other Ambulatory Visit: Payer: Self-pay

## 2019-08-14 ENCOUNTER — Encounter (HOSPITAL_BASED_OUTPATIENT_CLINIC_OR_DEPARTMENT_OTHER): Payer: Self-pay | Admitting: Emergency Medicine

## 2019-08-14 DIAGNOSIS — B349 Viral infection, unspecified: Secondary | ICD-10-CM | POA: Diagnosis not present

## 2019-08-14 DIAGNOSIS — Z79899 Other long term (current) drug therapy: Secondary | ICD-10-CM | POA: Insufficient documentation

## 2019-08-14 DIAGNOSIS — R0602 Shortness of breath: Secondary | ICD-10-CM | POA: Diagnosis not present

## 2019-08-14 DIAGNOSIS — I10 Essential (primary) hypertension: Secondary | ICD-10-CM | POA: Diagnosis not present

## 2019-08-14 DIAGNOSIS — R519 Headache, unspecified: Secondary | ICD-10-CM | POA: Diagnosis not present

## 2019-08-14 DIAGNOSIS — Z20822 Contact with and (suspected) exposure to covid-19: Secondary | ICD-10-CM | POA: Diagnosis not present

## 2019-08-14 DIAGNOSIS — D72829 Elevated white blood cell count, unspecified: Secondary | ICD-10-CM | POA: Diagnosis not present

## 2019-08-14 DIAGNOSIS — R509 Fever, unspecified: Secondary | ICD-10-CM | POA: Diagnosis present

## 2019-08-14 LAB — COMPREHENSIVE METABOLIC PANEL
ALT: 48 U/L — ABNORMAL HIGH (ref 0–44)
AST: 49 U/L — ABNORMAL HIGH (ref 15–41)
Albumin: 4.4 g/dL (ref 3.5–5.0)
Alkaline Phosphatase: 73 U/L (ref 38–126)
Anion gap: 10 (ref 5–15)
BUN: 9 mg/dL (ref 6–20)
CO2: 25 mmol/L (ref 22–32)
Calcium: 9.1 mg/dL (ref 8.9–10.3)
Chloride: 100 mmol/L (ref 98–111)
Creatinine, Ser: 0.79 mg/dL (ref 0.44–1.00)
GFR calc Af Amer: >60 mL/min (ref >60)
GFR calc non Af Amer: >60 mL/min (ref >60)
Glucose, Bld: 128 mg/dL — ABNORMAL HIGH (ref 70–99)
Potassium: 3.6 mmol/L (ref 3.5–5.1)
Sodium: 135 mmol/L (ref 135–145)
Total Bilirubin: 1.4 mg/dL — ABNORMAL HIGH (ref 0.3–1.2)
Total Protein: 8.6 g/dL — ABNORMAL HIGH (ref 6.5–8.1)

## 2019-08-14 LAB — D-DIMER, QUANTITATIVE: D-Dimer, Quant: 0.77 ug/mL-FEU — ABNORMAL HIGH (ref 0.00–0.50)

## 2019-08-14 LAB — URINALYSIS, ROUTINE W REFLEX MICROSCOPIC
Bilirubin Urine: NEGATIVE
Glucose, UA: NEGATIVE mg/dL
Hgb urine dipstick: NEGATIVE
Ketones, ur: NEGATIVE mg/dL
Leukocytes,Ua: NEGATIVE
Nitrite: NEGATIVE
Protein, ur: NEGATIVE mg/dL
Specific Gravity, Urine: 1.015 (ref 1.005–1.030)
pH: 6 (ref 5.0–8.0)

## 2019-08-14 LAB — CBC WITH DIFFERENTIAL/PLATELET
Abs Immature Granulocytes: 0.1 10*3/uL — ABNORMAL HIGH (ref 0.00–0.07)
Basophils Absolute: 0 10*3/uL (ref 0.0–0.1)
Basophils Relative: 0 %
Eosinophils Absolute: 0 10*3/uL (ref 0.0–0.5)
Eosinophils Relative: 0 %
HCT: 39 % (ref 36.0–46.0)
Hemoglobin: 13.1 g/dL (ref 12.0–15.0)
Immature Granulocytes: 1 %
Lymphocytes Relative: 8 %
Lymphs Abs: 1.4 10*3/uL (ref 0.7–4.0)
MCH: 29.2 pg (ref 26.0–34.0)
MCHC: 33.6 g/dL (ref 30.0–36.0)
MCV: 87.1 fL (ref 80.0–100.0)
Monocytes Absolute: 0.5 10*3/uL (ref 0.1–1.0)
Monocytes Relative: 3 %
Neutro Abs: 16.1 10*3/uL — ABNORMAL HIGH (ref 1.7–7.7)
Neutrophils Relative %: 88 %
Platelets: 266 10*3/uL (ref 150–400)
RBC: 4.48 MIL/uL (ref 3.87–5.11)
RDW: 13.4 % (ref 11.5–15.5)
WBC: 18.2 10*3/uL — ABNORMAL HIGH (ref 4.0–10.5)
nRBC: 0 % (ref 0.0–0.2)

## 2019-08-14 LAB — PREGNANCY, URINE: Preg Test, Ur: NEGATIVE

## 2019-08-14 LAB — SARS CORONAVIRUS 2 BY RT PCR (HOSPITAL ORDER, PERFORMED IN ~~LOC~~ HOSPITAL LAB): SARS Coronavirus 2: NEGATIVE

## 2019-08-14 LAB — LACTIC ACID, PLASMA: Lactic Acid, Venous: 1.8 mmol/L (ref 0.5–1.9)

## 2019-08-14 MED ORDER — IOHEXOL 350 MG/ML SOLN
100.0000 mL | Freq: Once | INTRAVENOUS | Status: AC | PRN
  Administered 2019-08-14: 100 mL via INTRAVENOUS

## 2019-08-14 MED ORDER — ACETAMINOPHEN 500 MG PO TABS
1000.0000 mg | ORAL_TABLET | Freq: Once | ORAL | Status: AC
  Administered 2019-08-14: 1000 mg via ORAL
  Filled 2019-08-14: qty 2

## 2019-08-14 MED ORDER — ONDANSETRON 4 MG PO TBDP: 4.0000 mg | ORAL_TABLET | Freq: Three times a day (TID) | ORAL | 0 refills | Status: AC | PRN

## 2019-08-14 MED ORDER — DIPHENHYDRAMINE HCL 50 MG/ML IJ SOLN
12.5000 mg | Freq: Once | INTRAMUSCULAR | Status: AC
  Administered 2019-08-14: 12.5 mg via INTRAVENOUS
  Filled 2019-08-14: qty 1

## 2019-08-14 MED ORDER — BUTALBITAL-APAP-CAFFEINE 50-325-40 MG PO TABS: 1.0000 | ORAL_TABLET | Freq: Four times a day (QID) | ORAL | 0 refills | Status: AC | PRN

## 2019-08-14 MED ORDER — KETOROLAC TROMETHAMINE 15 MG/ML IJ SOLN
15.0000 mg | Freq: Once | INTRAMUSCULAR | Status: AC
  Administered 2019-08-14: 15 mg via INTRAVENOUS
  Filled 2019-08-14: qty 1

## 2019-08-14 MED ORDER — PROCHLORPERAZINE EDISYLATE 10 MG/2ML IJ SOLN
10.0000 mg | Freq: Once | INTRAMUSCULAR | Status: AC
  Administered 2019-08-14: 10 mg via INTRAVENOUS
  Filled 2019-08-14: qty 2

## 2019-08-14 MED ORDER — SODIUM CHLORIDE 0.9 % IV BOLUS
1000.0000 mL | Freq: Once | INTRAVENOUS | Status: AC
  Administered 2019-08-14: 1000 mL via INTRAVENOUS

## 2019-08-14 NOTE — ED Triage Notes (Addendum)
Fever and chills since last night with mild SOB. She had COVID in December and had both vaccines. She had both tylenol and ibuprofen this morning.

## 2019-08-14 NOTE — Discharge Instructions (Signed)
If you develop worsening headache, recurrent fevers, any neck pain or neck stiffness, vomiting, difficulty breathing or other new concerning symptom, please return to ER for reassessment.  Can take Zofran as needed for nausea.  Take Tylenol Motrin as needed for headaches and fevers.  For worsening headaches, can also take the prescribed Fioricet.  Note this can make you drowsy shot be taken while driving or operating heavy machinery.  If you have not had improvement in symptoms by tomorrow, please return to ER for recheck.  If her symptoms are improving, would still recommend recheck with primary doctor next week.

## 2019-08-14 NOTE — ED Provider Notes (Signed)
[facility] EMERGENCY DEPARTMENT Provider Note   CSN: [id number] Arrival date & time: 08/14/19  1102     History Chief Complaint  Patient presents with  . Fever    Laura Lara is a 29 y.o. female.  Presents to ER with concern for fever, chills.  States symptoms primarily going on since last night, been having some headaches, body aches, chills.  Took both Tylenol Motrin this morning.  Had some improvement with these measures.  Headache currently 9 out of 10 in severity, dull, achy, frontal.  Has not had any neck pain or neck stiffness.  Young child is also sick with fever with similar symptoms.  No cough, no vomiting or abdominal pain.  No dysuria, hematuria, vaginal discharge.  No chest pain.  HPI     Past Medical History:  Diagnosis Date  . H/O sexually transmitted disease    2010  . Hypertension     There are no problems to display for this patient.   Past Surgical History:  Procedure Laterality Date  . CESAREAN SECTION    . NO PAST SURGERIES       OB History    Gravida  4   Para  2   Term  2   Preterm      AB  1   Living  2     SAB  1   TAB      Ectopic      Multiple      Live Births              Family History  Problem Relation Age of Onset  . Other Neg Hx     Social History   Tobacco Use  . Smoking status: Never Smoker  . Smokeless tobacco: Never Used  Substance Use Topics  . Alcohol use: Yes    Comment: occ  . Drug use: No    Home Medications Prior to Admission medications   Medication Sig Start Date End Date Taking? Authorizing Provider  acyclovir (ZOVIRAX) 400 MG tablet Take 1 tablet (400 mg total) by mouth 3 (three) times daily. 05/07/13   Palumbo, April, MD  ALPRAZolam Prudy Feeler) 0.5 MG tablet Take 0.5 mg by mouth at bedtime as needed for sleep.    [provider]  butalbital-acetaminophen-caffeine (FIORICET) 50-325-40 MG tablet Take 1 tablet by mouth every 6 (six) hours as needed for headache or  migraine. 08/14/19 08/13/20  Milagros Loll, MD  cyclobenzaprine (FLEXERIL) 10 MG tablet Take 1 tablet (10 mg total) by mouth 2 (two) times daily as needed for muscle spasms. 12/29/15   Gwyneth Sprout, MD  fluconazole (DIFLUCAN) 150 MG tablet Take 1 tablet (150 mg total) by mouth once. 12/30/12   Elson Areas, PA-C  losartan (COZAAR) 100 MG tablet Take 100 mg by mouth daily.    [provider]  ondansetron (ZOFRAN-ODT) 4 MG disintegrating tablet Take 1 tablet (4 mg total) by mouth every 8 (eight) hours as needed for nausea or vomiting. Patient used this medication for nausea. 08/14/19   Milagros Loll, MD  Prenatal Vit-Fe Fumarate-FA (PRENATAL MULTIVITAMIN) TABS Take 1 tablet by mouth daily.    [provider]    Allergies    Patient has no known allergies.  Review of Systems   Review of Systems  Constitutional: Positive for chills, fatigue and fever.  HENT: Negative for ear pain and sore throat.   Eyes: Negative for pain and visual disturbance.  Respiratory: Positive for shortness  of breath. Negative for cough.   Cardiovascular: Negative for chest pain and palpitations.  Gastrointestinal: Negative for abdominal pain and vomiting.  Genitourinary: Negative for dysuria and hematuria.  Musculoskeletal: Positive for myalgias. Negative for arthralgias and back pain.  Skin: Negative for color change and rash.  Neurological: Positive for headaches. Negative for seizures and syncope.  All other systems reviewed and are negative.   Physical Exam Updated Vital Signs BP (!) 137/94 (BP Location: Right Arm)   Pulse (!) 108   Temp 98.9 F (37.2 C) (Oral)   Resp 15   Ht 5\' 5"  (1.651 m)   Wt 109.8 kg   LMP 08/08/2019   SpO2 95%   BMI 40.27 kg/m   Physical Exam Vitals and nursing note reviewed.  Constitutional:      General: She is not in acute distress.    Appearance: She is well-developed.  HENT:     Head: Normocephalic and atraumatic.     Nose: Nose normal.       Mouth/Throat:     Mouth: Mucous membranes are moist.     Pharynx: Oropharynx is clear. No oropharyngeal exudate or posterior oropharyngeal erythema.  Eyes:     Conjunctiva/sclera: Conjunctivae normal.  Cardiovascular:     Rate and Rhythm: Regular rhythm. Tachycardia present.     Heart sounds: No murmur.  Pulmonary:     Effort: Pulmonary effort is normal. No respiratory distress.     Breath sounds: Normal breath sounds.  Abdominal:     Palpations: Abdomen is soft.     Tenderness: There is no abdominal tenderness.  Musculoskeletal:        General: No deformity or signs of injury. Normal range of motion.     Cervical back: Normal range of motion and neck supple. No rigidity or tenderness.  Skin:    General: Skin is warm and dry.  Neurological:     General: No focal deficit present.     Mental Status: She is alert and oriented to person, place, and time.     Motor: No weakness.     Coordination: Coordination normal.     Gait: Gait normal.  Psychiatric:        Mood and Affect: Mood normal.        Behavior: Behavior normal.     ED Results / Procedures / Treatments   Labs (all labs ordered are listed, but only abnormal results are displayed) Labs Reviewed  CBC WITH DIFFERENTIAL/PLATELET - Abnormal; Notable for the following components:      Result Value   WBC 18.2 (*)    Neutro Abs 16.1 (*)    Abs Immature Granulocytes 0.10 (*)    All other components within normal limits  COMPREHENSIVE METABOLIC PANEL - Abnormal; Notable for the following components:   Glucose, Bld 128 (*)    Total Protein 8.6 (*)    AST 49 (*)    ALT 48 (*)    Total Bilirubin 1.4 (*)    All other components within normal limits  D-DIMER, QUANTITATIVE (NOT AT [facility]) - Abnormal; Notable for the following components:   D-Dimer, Quant 0.77 (*)    All other components within normal limits  SARS CORONAVIRUS 2 BY RT PCR (HOSPITAL ORDER, PERFORMED IN [facility])  CULTURE, BLOOD (ROUTINE X 2)   CULTURE, BLOOD (ROUTINE X 2)  LACTIC ACID, PLASMA  URINALYSIS, ROUTINE W REFLEX MICROSCOPIC  PREGNANCY, URINE  LACTIC ACID, PLASMA    EKG EKG Interpretation  Date/Time:  Saturday August 14 2019 11:26:48 EDT Ventricular Rate:  124 PR Interval:    QRS Duration: 82 QT Interval:  293 QTC Calculation: 421 R Axis:   64 Text Interpretation: Sinus tachycardia Nonspecific T abnormalities, diffuse leads no prior for comparison no stemi Confirmed by Madalyn Rob [phone]) on 08/14/2019 12:47:11 PM   Radiology CT Angio Chest PE W and/or Wo Contrast  Result Date: 08/14/2019 CLINICAL DATA:  PE suspected, shortness of breath EXAM: CT ANGIOGRAPHY CHEST WITH CONTRAST TECHNIQUE: Multidetector CT imaging of the chest was performed using the standard protocol during bolus administration of intravenous contrast. Multiplanar CT image reconstructions and MIPs were obtained to evaluate the vascular anatomy. CONTRAST:  148mL OMNIPAQUE IOHEXOL 350 MG/ML SOLN COMPARISON:  None. FINDINGS: Cardiovascular: Evaluation for pulmonary embolism is somewhat limited by breath motion artifact; within this limitation, no evidence of pulmonary embolism through the proximal segmental pulmonary arterial level. Normal heart size. No pericardial effusion. Mediastinum/Nodes: No enlarged mediastinal, hilar, or axillary lymph nodes. Thyroid gland, trachea, and esophagus demonstrate no significant findings. Lungs/Pleura: Lungs are clear. No pleural effusion or pneumothorax. Upper Abdomen: No acute abnormality. Musculoskeletal: No chest wall abnormality. No acute or significant osseous findings. Review of the MIP images confirms the above findings. IMPRESSION: Examination for pulmonary embolism is somewhat limited by breath motion artifact; within this limitation, no evidence of pulmonary embolism through the proximal segmental pulmonary arterial level. Electronically Signed   By: Eddie Candle M.D.   On: 08/14/2019 14:02   DG Chest Portable  1 View  Result Date: 08/14/2019 CLINICAL DATA:  Shortness of breath. Fever, chills. Symptoms started last night. EXAM: PORTABLE CHEST 1 VIEW COMPARISON:  01/29/2016 FINDINGS: Shallow lung inflation. The heart size and mediastinal contours are within normal limits. Both lungs are clear. The visualized skeletal structures are unremarkable. IMPRESSION: No active disease. Electronically Signed   By: Nolon Nations M.D.   On: 08/14/2019 12:51    Procedures Procedures (including critical care time)  Medications Ordered in ED Medications  acetaminophen (TYLENOL) tablet 1,000 mg (1,000 mg Oral Given 08/14/19 1208)  sodium chloride 0.9 % bolus 1,000 mL ( Intravenous Stopped 08/14/19 1257)  iohexol (OMNIPAQUE) 350 MG/ML injection 100 mL (100 mLs Intravenous Contrast Given 08/14/19 1313)  ketorolac (TORADOL) 15 MG/ML injection 15 mg (15 mg Intravenous Given 08/14/19 1453)  prochlorperazine (COMPAZINE) injection 10 mg (10 mg Intravenous Given 08/14/19 1454)  diphenhydrAMINE (BENADRYL) injection 12.5 mg (12.5 mg Intravenous Given 08/14/19 1454)    ED Course  I have reviewed the triage vital signs and the nursing notes.  Pertinent labs & imaging results that were available during my care of the patient were reviewed by me and considered in my medical decision making (see chart for details).  Clinical Course as of Aug 14 1539  Sat Aug 14, 2019  1529 Rechecked, headache significantly improved, discussed risks and benefits of lumbar puncture, patient wishes to go home, she is willing to return for recheck tomorrow should she have ongoing symptoms or develop any worsening symptoms   [RD]    Clinical Course User Index [RD] Lucrezia Starch, MD   MDM Rules/Calculators/A&P                      29 year old lady presenting to ER with concern for chills, fevers, body aches and headaches.  On exam patient noted to be significantly tachycardic, febrile.  Otherwise was well-appearing with nonfocal exam.  Started broad  work-up, dimer ordered to rule out PE, basic labs,  CXR, urine.  CXR negative for pneumonia, UA negative for infection.  LFTs normal.  No tenderness in abdomen, doubt acute abdominal process.  No GYN symptoms.  Covid negative.  Dimer elevated but CTA chest negative for PE though noted motion degraded. Furthermore, no pericardial effusion and no ekg changes to suggest pericarditis. Most likely suspect patient has acute viral illness.  Given she has no neck pain, neck stiffness, normal neck range of motion, low suspicion for CNS infection.  Given her headache, fever though, I discussed obtaining CT imaging, lumbar puncture to more definitively rule out acute intracranial process, CNS infection. Offered to proceed with CT/LP today or return to ER tomorrow if having persistent symptoms and reviewed associated risk/benefits. Pt feeling better after HA cocktail and wishes to go home at this time. Stressed need for return for worsening symptoms and return tomorrow if having persistent HA/fever.   After the discussed management above, the patient was determined to be safe for discharge.  The patient was in agreement with this plan and all questions regarding their care were answered.  ED return precautions were discussed and the patient will return to the ED with any significant worsening of condition.   Final Clinical Impression(s) / ED Diagnoses Final diagnoses:  Acute viral disease  Leukocytosis, unspecified type    Rx / DC Orders ED Discharge Orders         Ordered    butalbital-acetaminophen-caffeine (FIORICET) 50-325-40 MG tablet  Every 6 hours PRN     08/14/19 1532    ondansetron (ZOFRAN-ODT) 4 MG disintegrating tablet  Every 8 hours PRN     08/14/19 1532           Milagros Loll, MD 08/14/19 [phone]

## 2019-08-19 LAB — CULTURE, BLOOD (ROUTINE X 2)
Culture: NO GROWTH
Culture: NO GROWTH
Special Requests: ADEQUATE
Special Requests: ADEQUATE

## 2020-06-18 IMAGING — CT CT ANGIO CHEST
2 of 10 series · 18 of 36 positions shown · IV contrast (omnipaque)
Comparison: None.

CLINICAL DATA: PE suspected, shortness of breath

EXAM:
CT ANGIOGRAPHY CHEST WITH CONTRAST
TECHNIQUE: Multidetector CT imaging of the chest was performed using the
standard protocol during bolus administration of intravenous
contrast. Multiplanar CT image reconstructions and MIPs were
obtained to evaluate the vascular anatomy.
CONTRAST:  100mL OMNIPAQUE IOHEXOL 350 MG/ML SOLN

[Series 7: pe thins · axial · 0.79mm/px · z∈[-201,+17]mm · 17 of 246 slices shown]
[im 14/246  lung]
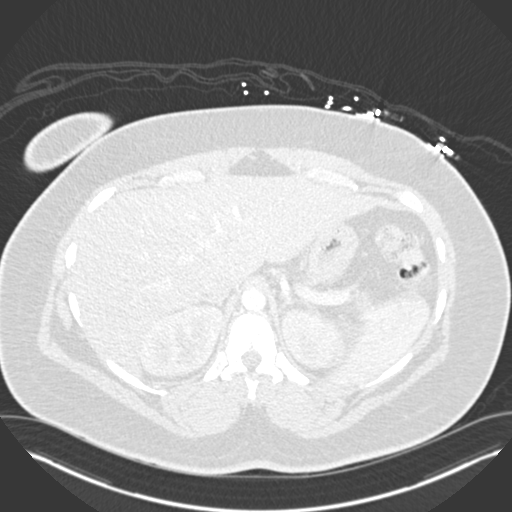
[im 28/246  mediastinal]
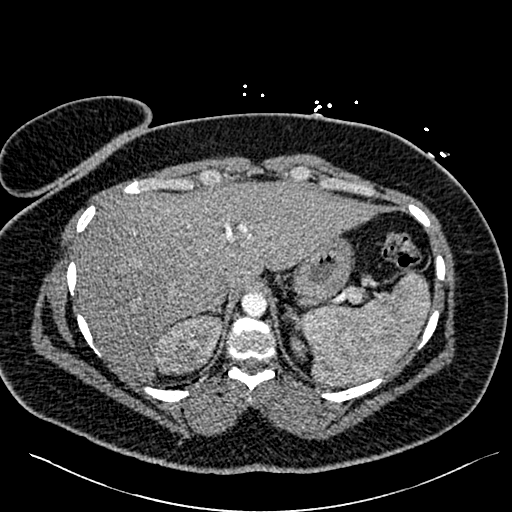
[im 41/246  lung]
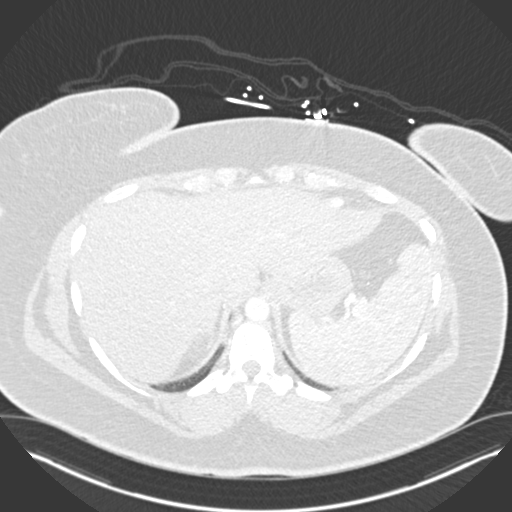
[im 55/246  mediastinal]
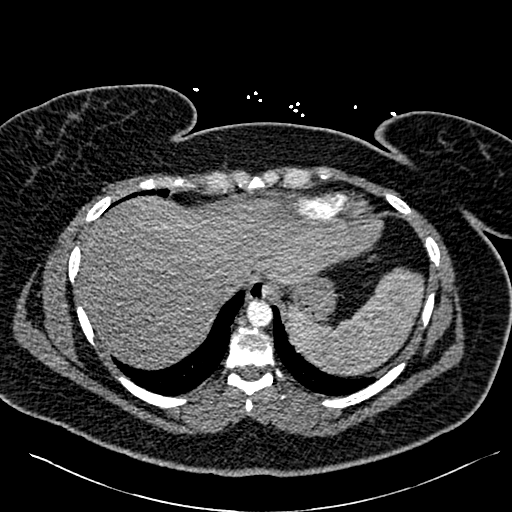
[im 69/246  lung]
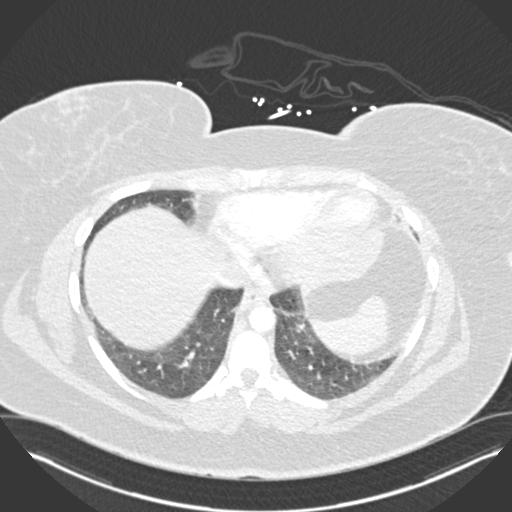
[im 82/246  mediastinal]
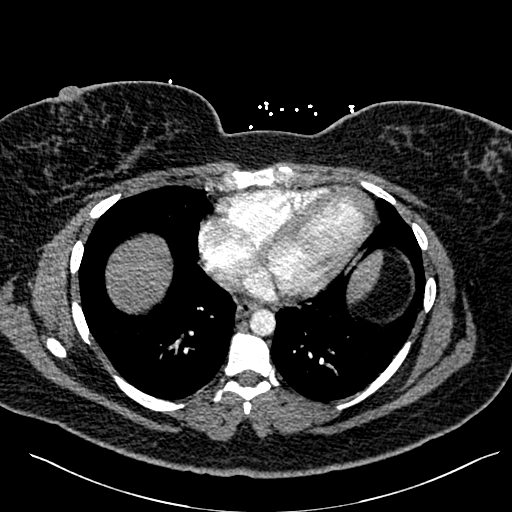
[im 96/246  lung]
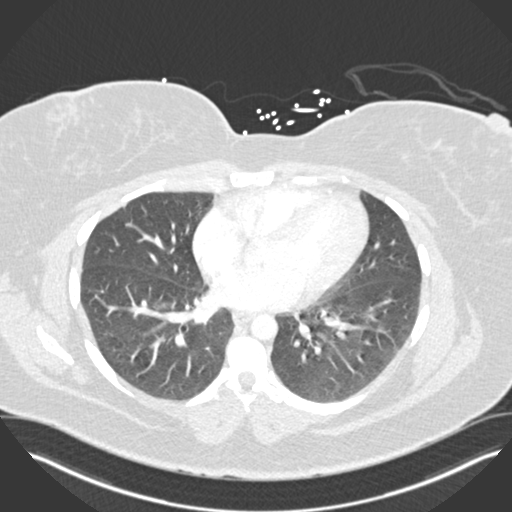
[im 109/246  mediastinal]
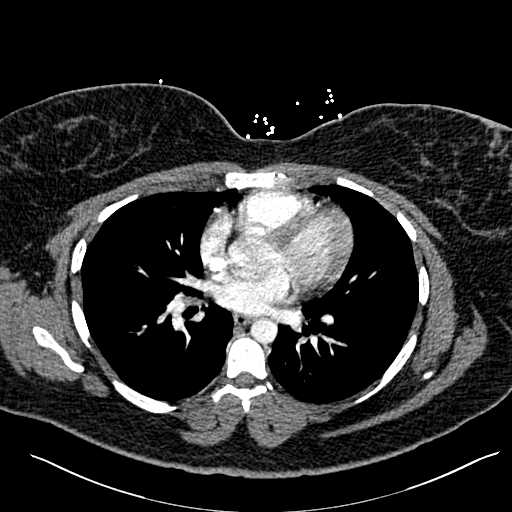
[im 123/246  lung]
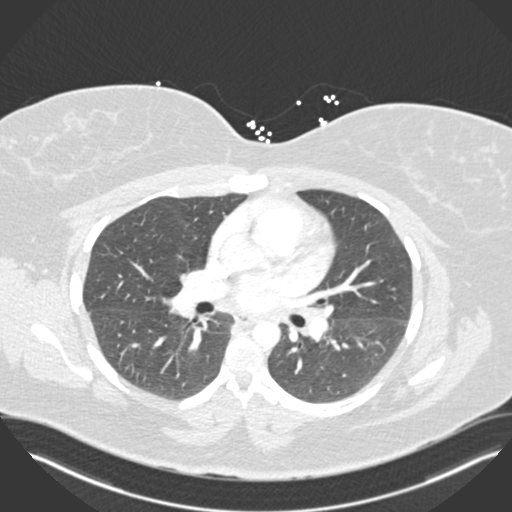
[im 137/246  mediastinal]
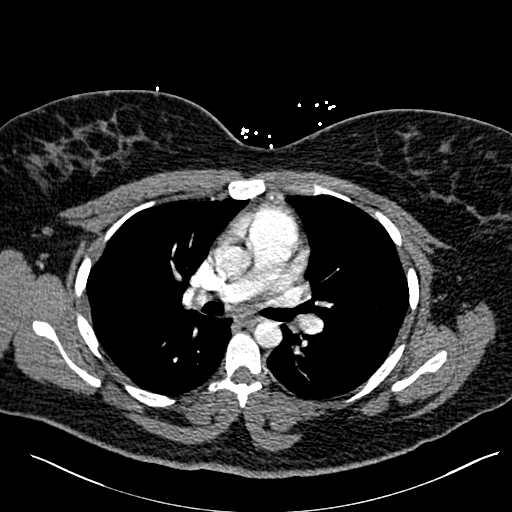
[im 150/246  lung]
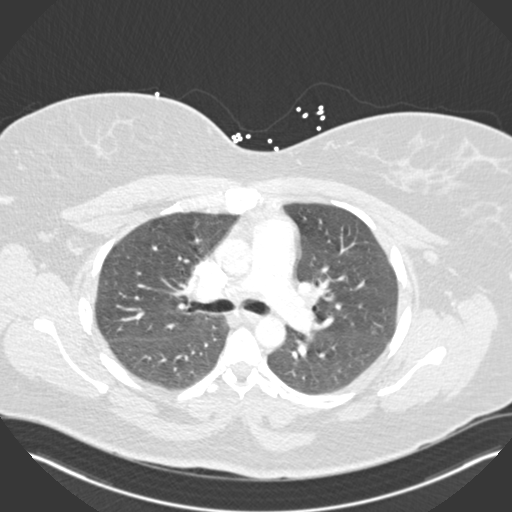
[im 164/246  mediastinal]
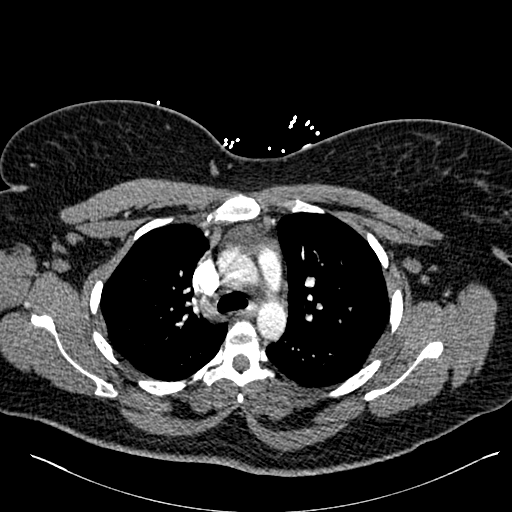
[im 177/246  lung]
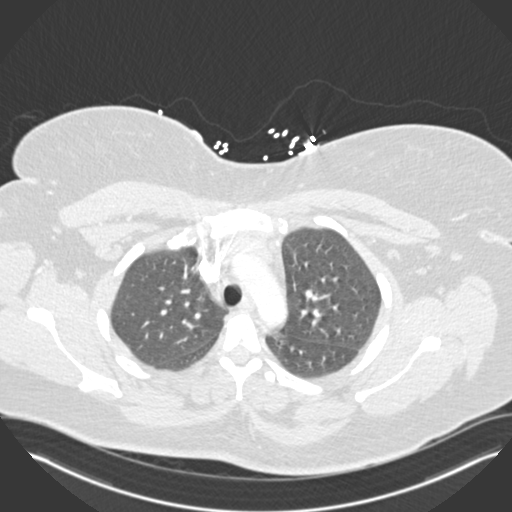
[im 191/246  mediastinal]
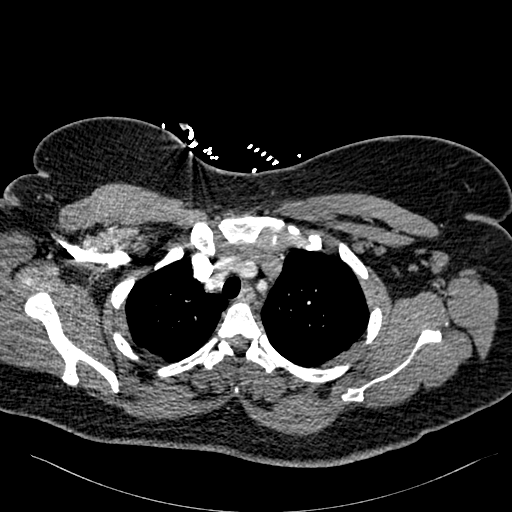
[im 205/246  lung]
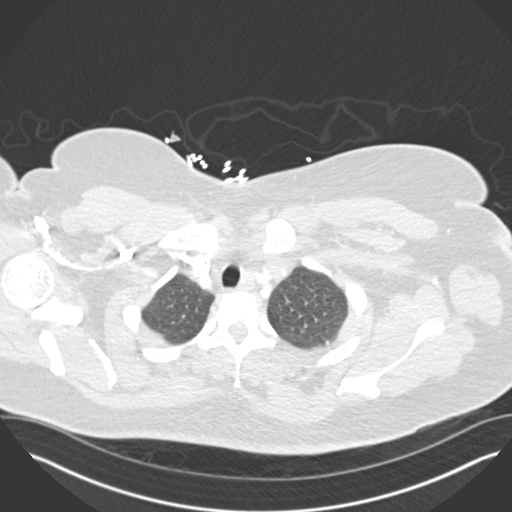
[im 218/246  mediastinal]
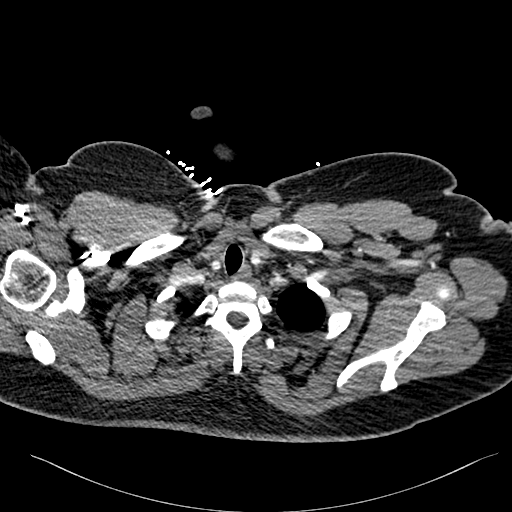
[im 232/246  lung]
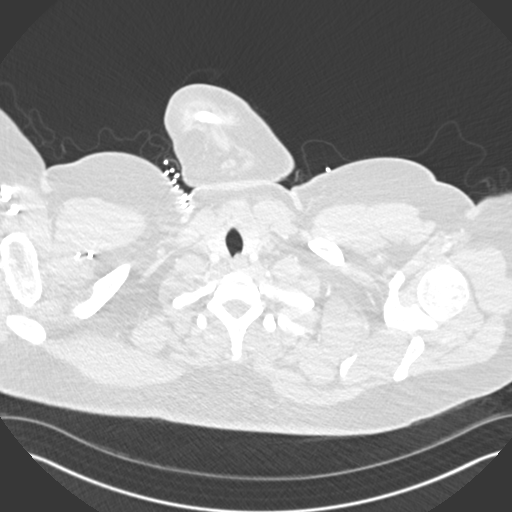

[Series 9: pe coronal mpr · coronal · 0.53mm/px · 1 of 148 slices shown]
[im 74/148  mediastinal]
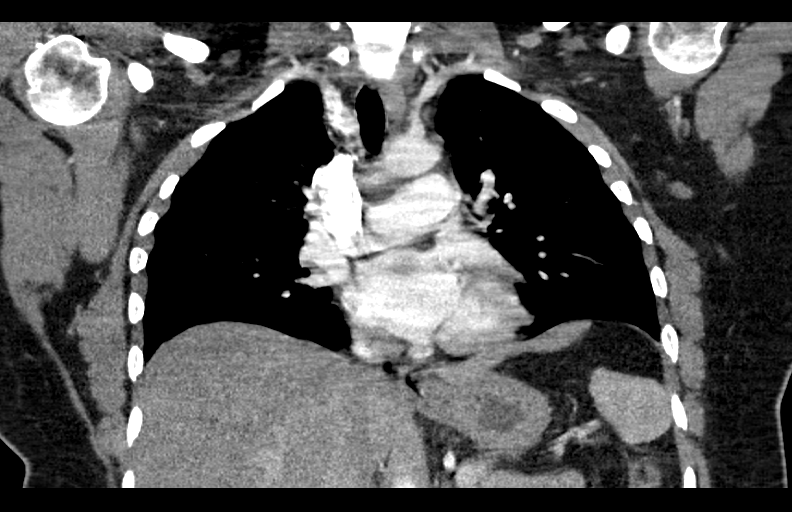

[18 of 36 positions shown; findings below may reference images not displayed]

FINDINGS: Cardiovascular: Evaluation for pulmonary embolism is somewhat
limited by breath motion artifact; within this limitation, no
evidence of pulmonary embolism through the proximal segmental
pulmonary arterial level. Normal heart size. No pericardial
effusion.

Mediastinum/Nodes: No enlarged mediastinal, hilar, or axillary lymph
nodes. Thyroid gland, trachea, and esophagus demonstrate no
significant findings.

Lungs/Pleura: Lungs are clear. No pleural effusion or pneumothorax.

Upper Abdomen: No acute abnormality.

Musculoskeletal: No chest wall abnormality. No acute or significant
osseous findings.

Review of the MIP images confirms the above findings.
IMPRESSION: Examination for pulmonary embolism is somewhat limited by breath
motion artifact; within this limitation, no evidence of pulmonary
embolism through the proximal segmental pulmonary arterial level.

## 2021-10-17 ENCOUNTER — Emergency Department (HOSPITAL_BASED_OUTPATIENT_CLINIC_OR_DEPARTMENT_OTHER): Payer: Medicaid Other

## 2021-10-17 ENCOUNTER — Other Ambulatory Visit: Payer: Self-pay

## 2021-10-17 ENCOUNTER — Emergency Department (HOSPITAL_BASED_OUTPATIENT_CLINIC_OR_DEPARTMENT_OTHER)
Admission: EM | Admit: 2021-10-17 | Discharge: 2021-10-17 | Disposition: A | Discharge status: Home / Self Care | Payer: Medicaid Other | Attending: Emergency Medicine | Admitting: Emergency Medicine

## 2021-10-17 ENCOUNTER — Encounter (HOSPITAL_BASED_OUTPATIENT_CLINIC_OR_DEPARTMENT_OTHER): Payer: Self-pay | Admitting: Emergency Medicine

## 2021-10-17 DIAGNOSIS — Z79899 Other long term (current) drug therapy: Secondary | ICD-10-CM | POA: Diagnosis not present

## 2021-10-17 DIAGNOSIS — R030 Elevated blood-pressure reading, without diagnosis of hypertension: Secondary | ICD-10-CM

## 2021-10-17 DIAGNOSIS — I10 Essential (primary) hypertension: Secondary | ICD-10-CM | POA: Insufficient documentation

## 2021-10-17 DIAGNOSIS — R519 Headache, unspecified: Secondary | ICD-10-CM | POA: Diagnosis present

## 2021-10-17 LAB — CBC WITH DIFFERENTIAL/PLATELET
Abs Immature Granulocytes: 0 10*3/uL (ref 0.00–0.07)
Basophils Absolute: 0 10*3/uL (ref 0.0–0.1)
Basophils Relative: 0 %
Eosinophils Absolute: 0 10*3/uL (ref 0.0–0.5)
Eosinophils Relative: 1 %
HCT: 39 % (ref 36.0–46.0)
Hemoglobin: 13.6 g/dL (ref 12.0–15.0)
Immature Granulocytes: 0 %
Lymphocytes Relative: 36 %
Lymphs Abs: 1.8 10*3/uL (ref 0.7–4.0)
MCH: 30.9 pg (ref 26.0–34.0)
MCHC: 34.9 g/dL (ref 30.0–36.0)
MCV: 88.6 fL (ref 80.0–100.0)
Monocytes Absolute: 0.5 10*3/uL (ref 0.1–1.0)
Monocytes Relative: 10 %
Neutro Abs: 2.8 10*3/uL (ref 1.7–7.7)
Neutrophils Relative %: 53 %
Platelets: 246 10*3/uL (ref 150–400)
RBC: 4.4 MIL/uL (ref 3.87–5.11)
RDW: 12.2 % (ref 11.5–15.5)
WBC: 5.2 10*3/uL (ref 4.0–10.5)
nRBC: 0 % (ref 0.0–0.2)

## 2021-10-17 LAB — COMPREHENSIVE METABOLIC PANEL
ALT: 18 U/L (ref 0–44)
AST: 17 U/L (ref 15–41)
Albumin: 4.3 g/dL (ref 3.5–5.0)
Alkaline Phosphatase: 46 U/L (ref 38–126)
Anion gap: 8 (ref 5–15)
BUN: 8 mg/dL (ref 6–20)
CO2: 25 mmol/L (ref 22–32)
Calcium: 9.1 mg/dL (ref 8.9–10.3)
Chloride: 103 mmol/L (ref 98–111)
Creatinine, Ser: 0.76 mg/dL (ref 0.44–1.00)
GFR, Estimated: >60 mL/min (ref >60)
Glucose, Bld: 97 mg/dL (ref 70–99)
Potassium: 3.4 mmol/L — ABNORMAL LOW (ref 3.5–5.1)
Sodium: 136 mmol/L (ref 135–145)
Total Bilirubin: 1.3 mg/dL — ABNORMAL HIGH (ref 0.3–1.2)
Total Protein: 8.3 g/dL — ABNORMAL HIGH (ref 6.5–8.1)

## 2021-10-17 LAB — HCG, SERUM, QUALITATIVE: Preg, Serum: NEGATIVE

## 2021-10-17 MED ORDER — HYDROCODONE-ACETAMINOPHEN 5-325 MG PO TABS
1.0000 | ORAL_TABLET | Freq: Once | ORAL | Status: AC
  Administered 2021-10-17: 1 via ORAL
  Filled 2021-10-17: qty 1

## 2021-10-17 MED ORDER — NAPROXEN 500 MG PO TABS
500.0000 mg | ORAL_TABLET | Freq: Two times a day (BID) | ORAL | 0 refills | Status: AC
Start: 2021-10-17 — End: ?

## 2021-10-17 NOTE — Discharge Instructions (Signed)
Your testing today is reassuring.  Your blood pressure is likely elevated in the setting of pain.  Take your medication as prescribed and follow-up with your doctor for further medication adjustments.  Return to the ED with worsening headache, chest pain, shortness of breath, difficulty breathing or other concern

## 2021-10-17 NOTE — ED Provider Notes (Signed)
[facility]ENC[facility] DEPARTMENT Provider Note   CSN: [id number] Arrival date & time: 10/17/21  1646     History  Chief Complaint  Patient presents with   Hypertension    Laura Lara is a 31 y.o. female.  Patient with history of hypertension on losartan here with elevated blood pressure.  States she went to the dentist today because of a problem with her left lower molar for the past 3 days and believes she has a cavity there.  Dentist would not do any procedure because her blood pressure was 180/119.  She states compliance with her blood pressure medication has not missed any doses.  States her normal blood pressures in the 130 range.  She attempted to call her PCP but was not able to see them today.  She endorses a left-sided gradual onset headache near the site of the tooth.  No thunderclap onset.  No chest pain or shortness of breath.  No nausea or vomiting.  No visual changes.  No focal weakness, numbness or tingling.  No difficulty breathing or difficulty swallowing.  The history is provided by the patient.  Hypertension       Home Medications Prior to Admission medications   Medication Sig Start Date End Date Taking? Authorizing Provider  acyclovir (ZOVIRAX) 400 MG tablet Take 1 tablet (400 mg total) by mouth 3 (three) times daily. 05/07/13   Palumbo, April, MD  ALPRAZolam Duanne Moron) 0.5 MG tablet Take 0.5 mg by mouth at bedtime as needed for sleep.    [provider]  cyclobenzaprine (FLEXERIL) 10 MG tablet Take 1 tablet (10 mg total) by mouth 2 (two) times daily as needed for muscle spasms. 12/29/15   Blanchie Dessert, MD  fluconazole (DIFLUCAN) 150 MG tablet Take 1 tablet (150 mg total) by mouth once. 12/30/12   Fransico Meadow, PA-C  losartan (COZAAR) 100 MG tablet Take 100 mg by mouth daily.    [provider]  ondansetron (ZOFRAN-ODT) 4 MG disintegrating tablet Take 1 tablet (4 mg total) by mouth every 8 (eight) hours as needed for nausea or  vomiting. Patient used this medication for nausea. 08/14/19   Lucrezia Starch, MD  Prenatal Vit-Fe Fumarate-FA (PRENATAL MULTIVITAMIN) TABS Take 1 tablet by mouth daily.    [provider]      Allergies    Patient has no known allergies.    Review of Systems   Review of Systems  HENT:  Positive for dental problem.     Physical Exam Updated Vital Signs BP (!) 157/101 (BP Location: Left Arm)   Pulse 86   Temp 98.2 F (36.8 C)   Resp 18   Ht 5\' 5"  (1.651 m)   Wt 94.3 kg   LMP 09/24/2021 (Approximate)   SpO2 100%   BMI 34.61 kg/m  Physical Exam Vitals and nursing note reviewed.  Constitutional:      General: She is not in acute distress.    Appearance: She is well-developed.  HENT:     Head: Normocephalic and atraumatic.     Mouth/Throat:     Pharynx: No oropharyngeal exudate.     Comments: Left lower molar with caries.  No surrounding abscess.  Floor mouth is soft Eyes:     Conjunctiva/sclera: Conjunctivae normal.     Pupils: Pupils are equal, round, and reactive to light.  Neck:     Comments: No meningismus. Cardiovascular:     Rate and Rhythm: Normal rate and regular rhythm.  Heart sounds: Normal heart sounds. No murmur heard. Pulmonary:     Effort: Pulmonary effort is normal. No respiratory distress.     Breath sounds: Normal breath sounds.  Abdominal:     Palpations: Abdomen is soft.     Tenderness: There is no abdominal tenderness. There is no guarding or rebound.  Musculoskeletal:        General: No tenderness. Normal range of motion.     Cervical back: Normal range of motion and neck supple.  Skin:    General: Skin is warm.  Neurological:     Mental Status: She is alert and oriented to person, place, and time.     Cranial Nerves: No cranial nerve deficit.     Motor: No abnormal muscle tone.     Coordination: Coordination normal.     Comments:  5/5 strength throughout. CN 2-12 intact.Equal grip strength.   Psychiatric:        Behavior:  Behavior normal.     ED Results / Procedures / Treatments   Labs (all labs ordered are listed, but only abnormal results are displayed) Labs Reviewed  COMPREHENSIVE METABOLIC PANEL - Abnormal; Notable for the following components:      Result Value   Potassium 3.4 (*)    Total Protein 8.3 (*)    Total Bilirubin 1.3 (*)    All other components within normal limits  CBC WITH DIFFERENTIAL/PLATELET  HCG, SERUM, QUALITATIVE    EKG None  Radiology CT Head Wo Contrast  Result Date: 10/17/2021 CLINICAL DATA:  Headache.  Neural deficit. EXAM: CT HEAD WITHOUT CONTRAST TECHNIQUE: Contiguous axial images were obtained from the base of the skull through the vertex without intravenous contrast. RADIATION DOSE REDUCTION: This exam was performed according to the departmental dose-optimization program which includes automated exposure control, adjustment of the mA and/or kV according to patient size and/or use of iterative reconstruction technique. COMPARISON:  None Available. FINDINGS: Brain: No evidence of acute infarction, hemorrhage, hydrocephalus, extra-axial collection or mass lesion/mass effect. Vascular: No hyperdense vessel or unexpected calcification. Skull: Normal. Negative for fracture or focal lesion. Sinuses/Orbits: No acute finding. Other: None. IMPRESSION: No acute intracranial abnormality. Electronically Signed   By: Ted Mcalpine M.D.   On: 10/17/2021 18:06    Procedures Procedures    Medications Ordered in ED Medications  HYDROcodone-acetaminophen (NORCO/VICODIN) 5-325 MG per tablet 1 tablet (has no administration in time range)    ED Course/ Medical Decision Making/ A&P                           Medical Decision Making Amount and/or Complexity of Data Reviewed Labs: ordered. Decision-making details documented in ED Course. Radiology: ordered and independent interpretation performed. Decision-making details documented in ED Course. ECG/medicine tests: ordered and  independent interpretation performed. Decision-making details documented in ED Course.  Risk Prescription drug management.   Elevated blood pressure with history of same.  Neurologically intact.  Low suspicion for hypertensive emergency or urgency.  Does have a dental carie but no evidence of drainable abscess and no evidence of Ludwig angina.  Blood pressure has already improved to 157/100.  Workup is reassuring.  No evidence of endorgan damage.  Blood pressure spontaneously improved to 144/95.  Will not make medication adjustments at this time.  Instructed to keep a record of her blood pressure and follow-up with her primary doctor for further medication adjustments. CT head is negative without hemorrhage.  Discussed follow-up with PCP as well  as dentist given her dental problems and need for further medication adjustments. Return precautions discussed.        Final Clinical Impression(s) / ED Diagnoses Final diagnoses:  Elevated blood pressure reading    Rx / DC Orders ED Discharge Orders     None         Solace Wendorff, Jeannett Senior, MD 10/17/21 2037

## 2021-10-17 NOTE — ED Triage Notes (Signed)
Patient states she went to the dentist today due to a chipped tooth and they would not do anything for her because her blood pressure was 188/118. Patient was unable to get into her PCP today.
